# Patient Record
Sex: Male | Born: 1945 | Race: White | Hispanic: No | Marital: Married | Smoking: Former smoker
Health system: Southern US, Community
[De-identification: ages and names within clinical notes are randomized; demographics above are authoritative.]

## PROBLEM LIST (undated history)

## (undated) DIAGNOSIS — E119 Type 2 diabetes mellitus without complications: Secondary | ICD-10-CM

## (undated) DIAGNOSIS — N400 Enlarged prostate without lower urinary tract symptoms: Secondary | ICD-10-CM

## (undated) DIAGNOSIS — J449 Chronic obstructive pulmonary disease, unspecified: Secondary | ICD-10-CM

## (undated) DIAGNOSIS — G4733 Obstructive sleep apnea (adult) (pediatric): Secondary | ICD-10-CM

## (undated) DIAGNOSIS — I10 Essential (primary) hypertension: Secondary | ICD-10-CM

## (undated) HISTORY — DX: Essential (primary) hypertension: I10

## (undated) HISTORY — DX: Benign prostatic hyperplasia without lower urinary tract symptoms: N40.0

## (undated) HISTORY — PX: APPENDECTOMY: SHX54

## (undated) HISTORY — DX: Obstructive sleep apnea (adult) (pediatric): G47.33

## (undated) HISTORY — DX: Type 2 diabetes mellitus without complications: E11.9

## (undated) HISTORY — DX: Chronic obstructive pulmonary disease, unspecified: J44.9

## (undated) HISTORY — PX: ADENOIDECTOMY: SUR15

---

## 2013-07-20 ENCOUNTER — Encounter: Payer: Self-pay | Admitting: Internal Medicine

## 2013-07-20 ENCOUNTER — Ambulatory Visit (INDEPENDENT_AMBULATORY_CARE_PROVIDER_SITE_OTHER): Payer: Medicare HMO | Admitting: Internal Medicine

## 2013-07-20 ENCOUNTER — Encounter (INDEPENDENT_AMBULATORY_CARE_PROVIDER_SITE_OTHER): Payer: Self-pay

## 2013-07-20 VITALS — BP 154/90 | HR 90 | Temp 98.8°F | Ht 69.0 in | Wt 233.0 lb

## 2013-07-20 DIAGNOSIS — J438 Other emphysema: Secondary | ICD-10-CM

## 2013-07-20 DIAGNOSIS — N400 Enlarged prostate without lower urinary tract symptoms: Secondary | ICD-10-CM

## 2013-07-20 DIAGNOSIS — G4733 Obstructive sleep apnea (adult) (pediatric): Secondary | ICD-10-CM | POA: Insufficient documentation

## 2013-07-20 DIAGNOSIS — J449 Chronic obstructive pulmonary disease, unspecified: Secondary | ICD-10-CM | POA: Insufficient documentation

## 2013-07-20 DIAGNOSIS — I10 Essential (primary) hypertension: Secondary | ICD-10-CM | POA: Insufficient documentation

## 2013-07-20 DIAGNOSIS — E119 Type 2 diabetes mellitus without complications: Secondary | ICD-10-CM

## 2013-07-20 LAB — CBC WITH DIFFERENTIAL/PLATELET
BASOS ABS: 0 10*3/uL (ref 0.0–0.1)
BASOS PCT: 0 % (ref 0–1)
EOS ABS: 0.1 10*3/uL (ref 0.0–0.7)
Eosinophils Relative: 1 % (ref 0–5)
HCT: 44.1 % (ref 39.0–52.0)
Hemoglobin: 15.3 g/dL (ref 13.0–17.0)
Lymphocytes Relative: 21 % (ref 12–46)
Lymphs Abs: 1.5 10*3/uL (ref 0.7–4.0)
MCH: 32.3 pg (ref 26.0–34.0)
MCHC: 34.7 g/dL (ref 30.0–36.0)
MCV: 93 fL (ref 78.0–100.0)
Monocytes Absolute: 0.4 10*3/uL (ref 0.1–1.0)
Monocytes Relative: 6 % (ref 3–12)
Neutro Abs: 5.1 10*3/uL (ref 1.7–7.7)
Neutrophils Relative %: 72 % (ref 43–77)
PLATELETS: 138 10*3/uL — AB (ref 150–400)
RBC: 4.74 MIL/uL (ref 4.22–5.81)
RDW: 14.4 % (ref 11.5–15.5)
WBC: 7.1 10*3/uL (ref 4.0–10.5)

## 2013-07-20 MED ORDER — TRAZODONE HCL 50 MG PO TABS: 50.0000 mg | ORAL_TABLET | Freq: Every day | ORAL | Status: AC

## 2013-07-20 MED ORDER — METFORMIN HCL 500 MG PO TABS: 500.0000 mg | ORAL_TABLET | Freq: Two times a day (BID) | ORAL | Status: AC

## 2013-07-20 MED ORDER — LOSARTAN POTASSIUM 25 MG PO TABS: 25.0000 mg | ORAL_TABLET | Freq: Every day | ORAL | Status: AC

## 2013-07-20 NOTE — Assessment & Plan Note (Signed)
Sounds like he might have become impaired fasting glucose after improving diet Will check labs

## 2013-07-20 NOTE — Progress Notes (Signed)
rx called into pharmacy

## 2013-07-20 NOTE — Progress Notes (Signed)
Subjective:    Patient ID: Charles Murphy, male    DOB: 03/28/1945, 68 y.o.   MRN: [id number]  HPI Here to establish He is in town living with his father--who is home bound on hospice care [place] in [place]  Has diabetes-though he wonders about this. His sugars were high but improved just off [place] Diagnosed in ~2000 He does not monitor his sugars regularly Gets blood work every 6 months--- last A1c ~6%  Was put on losartan recently Only took 1--then dizzy the next morning Stopped it then  Cholesterol always fine Never been on machine  Obstructive sleep apnea Had septoplasty and adenoidectomy Better since then but still needs to use the CPAP nightly Does use the trazodone nightly  No current outpatient prescriptions on file prior to visit.   No current facility-administered medications on file prior to visit.    No Known Allergies  Past Medical History  Diagnosis Date  . Type II or unspecified type diabetes mellitus without mention of complication, not stated as uncontrolled   . Hypertension   . Obstructive sleep apnea   . BPH (benign prostatic hypertrophy)     Past Surgical History  Procedure Laterality Date  . Adenoidectomy      and septal repair  . Appendectomy      Family History  Problem Relation Age of Onset  . Cancer Mother     breast cancer  . Cancer Father     mesothelioma  . Cancer Brother     prostate cancer    History   Social History  . Marital Status: Married    Spouse Name: N/A    Number of Children: 2  . Years of Education: N/A   Occupational History  . 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 Smoking status: Former Games developermoker  . Smokeless tobacco: Never Used     Comment: rare cigar  . Alcohol Use: Yes  . Drug Use: No  . Sexual Activity: Not on file   Other Topics Concern  . Not on file   Social History Narrative    . No narrative on file   Review of Systems  Constitutional:       May have lost some weight recently (12#) Wears seat belt  HENT: Positive for hearing loss. Negative for tinnitus.        Has bilateral hearing aides Upcoming dental appt  Eyes: Negative for visual disturbance.       No retinopathy Regular with eye doctor  Respiratory: Positive for shortness of breath. Negative for cough and chest tightness.        Stable DOE  Cardiovascular: Negative for chest pain, palpitations and leg swelling.  Gastrointestinal: Negative for nausea, vomiting, abdominal pain, constipation and blood in stool.       No heartburn  Genitourinary: Negative for urgency and frequency.       Mild urinary symptoms  Musculoskeletal: Negative for arthralgias and back pain.  Neurological: Positive for dizziness. Negative for syncope, weakness and numbness.  Psychiatric/Behavioral: Positive for sleep disturbance. Negative for dysphoric mood. The patient is not nervous/anxious.        Objective:   Physical Exam  Constitutional: He appears well-developed and well-nourished. No distress.  HENT:  Mouth/Throat: Oropharynx is clear and moist. No oropharyngeal exudate.  Neck: Normal range of motion. Neck supple. No thyromegaly  present.  Cardiovascular: Normal rate, regular rhythm, normal heart sounds and intact distal pulses.  Exam reveals no gallop.   No murmur heard. Pulmonary/Chest: Effort normal and breath sounds normal. No respiratory distress. He has no wheezes. He has no rales.  Abdominal: Soft. There is no tenderness.  Musculoskeletal: He exhibits no edema and no tenderness.  Lymphadenopathy:    He has no cervical adenopathy.  Neurological:  Normal fine touch sensation in feet  Skin:  Brawny stasis changes on lower calves No foot ulcers  Psychiatric: He has a normal mood and affect. His behavior is normal.          Assessment & Plan:

## 2013-07-20 NOTE — Assessment & Plan Note (Signed)
BP Readings from Last 3 Encounters:  07/20/13 154/90   Urged him to restart the losartan

## 2013-07-20 NOTE — Assessment & Plan Note (Signed)
Mild symptoms Will hold off on tamsulosin to avoid possible dizziness with restarting losartan

## 2013-07-20 NOTE — Assessment & Plan Note (Signed)
Has improved some lately No meds

## 2013-07-21 LAB — COMPREHENSIVE METABOLIC PANEL
ALK PHOS: 83 U/L (ref 39–117)
ALT: 25 U/L (ref 0–53)
AST: 19 U/L (ref 0–37)
Albumin: 4.4 g/dL (ref 3.5–5.2)
BUN: 13 mg/dL (ref 6–23)
CO2: 25 meq/L (ref 19–32)
Calcium: 9.6 mg/dL (ref 8.4–10.5)
Chloride: 99 mEq/L (ref 96–112)
Creat: 0.93 mg/dL (ref 0.50–1.35)
Glucose, Bld: 382 mg/dL — ABNORMAL HIGH (ref 70–99)
Potassium: 4.4 mEq/L (ref 3.5–5.3)
Sodium: 135 mEq/L (ref 135–145)
TOTAL PROTEIN: 6.7 g/dL (ref 6.0–8.3)
Total Bilirubin: 1.3 mg/dL — ABNORMAL HIGH (ref 0.2–1.2)

## 2013-07-21 LAB — LIPID PANEL
Cholesterol: 106 mg/dL (ref 0–200)
HDL: 38 mg/dL — ABNORMAL LOW (ref >39)
LDL CALC: 26 mg/dL (ref 0–99)
Total CHOL/HDL Ratio: 2.8 Ratio
Triglycerides: 212 mg/dL — ABNORMAL HIGH (ref <150)
VLDL: 42 mg/dL — ABNORMAL HIGH (ref 0–40)

## 2013-07-21 LAB — HEMOGLOBIN A1C
HEMOGLOBIN A1C: 8.3 % — AB (ref <5.7)
Mean Plasma Glucose: 192 mg/dL — ABNORMAL HIGH (ref <117)

## 2013-07-21 LAB — T4, FREE: Free T4: 1.05 ng/dL (ref 0.80–1.80)

## 2013-07-23 ENCOUNTER — Encounter: Payer: Self-pay | Admitting: *Deleted

## 2013-07-23 ENCOUNTER — Telehealth: Payer: Self-pay | Admitting: Internal Medicine

## 2013-07-23 NOTE — Telephone Encounter (Signed)
Relevant patient education assigned to patient using Emmi. ° °

## 2014-07-05 ENCOUNTER — Telehealth: Payer: Self-pay

## 2014-07-05 NOTE — Telephone Encounter (Signed)
Diabetic Bundle. Left voicemail advising pt his A1C blood test is due. Pt advised to contact PCP's office to schedule.  

## 2016-05-19 ENCOUNTER — Emergency Department
Admission: EM | Admit: 2016-05-19 | Discharge: 2016-05-19 | Disposition: A | Discharge status: Home / Self Care | Payer: Medicare HMO | Attending: Emergency Medicine | Admitting: Emergency Medicine

## 2016-05-19 ENCOUNTER — Emergency Department: Payer: Medicare HMO

## 2016-05-19 DIAGNOSIS — J209 Acute bronchitis, unspecified: Secondary | ICD-10-CM | POA: Diagnosis not present

## 2016-05-19 DIAGNOSIS — Z87891 Personal history of nicotine dependence: Secondary | ICD-10-CM | POA: Insufficient documentation

## 2016-05-19 DIAGNOSIS — Z7982 Long term (current) use of aspirin: Secondary | ICD-10-CM | POA: Insufficient documentation

## 2016-05-19 DIAGNOSIS — J449 Chronic obstructive pulmonary disease, unspecified: Secondary | ICD-10-CM | POA: Insufficient documentation

## 2016-05-19 DIAGNOSIS — I10 Essential (primary) hypertension: Secondary | ICD-10-CM | POA: Diagnosis not present

## 2016-05-19 DIAGNOSIS — Z7984 Long term (current) use of oral hypoglycemic drugs: Secondary | ICD-10-CM | POA: Insufficient documentation

## 2016-05-19 DIAGNOSIS — R509 Fever, unspecified: Secondary | ICD-10-CM | POA: Diagnosis present

## 2016-05-19 DIAGNOSIS — E119 Type 2 diabetes mellitus without complications: Secondary | ICD-10-CM | POA: Diagnosis not present

## 2016-05-19 LAB — BASIC METABOLIC PANEL
Anion gap: 6 (ref 5–15)
BUN: 16 mg/dL (ref 6–20)
CALCIUM: 8.8 mg/dL — AB (ref 8.9–10.3)
CO2: 29 mmol/L (ref 22–32)
Chloride: 103 mmol/L (ref 101–111)
Creatinine, Ser: 1.11 mg/dL (ref 0.61–1.24)
GFR calc Af Amer: >60 mL/min (ref >60)
GLUCOSE: 149 mg/dL — AB (ref 65–99)
POTASSIUM: 3.9 mmol/L (ref 3.5–5.1)
Sodium: 138 mmol/L (ref 135–145)

## 2016-05-19 LAB — CBC
HEMATOCRIT: 40 % (ref 40.0–52.0)
Hemoglobin: 14.1 g/dL (ref 13.0–18.0)
MCH: 32.7 pg (ref 26.0–34.0)
MCHC: 35.1 g/dL (ref 32.0–36.0)
MCV: 93.1 fL (ref 80.0–100.0)
PLATELETS: 129 10*3/uL — AB (ref 150–440)
RBC: 4.3 MIL/uL — ABNORMAL LOW (ref 4.40–5.90)
RDW: 15.6 % — AB (ref 11.5–14.5)
WBC: 3.9 10*3/uL (ref 3.8–10.6)

## 2016-05-19 LAB — TROPONIN I: Troponin I: <0.03 ng/mL (ref <0.03)

## 2016-05-19 MED ORDER — ALBUTEROL SULFATE HFA 108 (90 BASE) MCG/ACT IN AERS: 2.0000 | INHALATION_SPRAY | Freq: Four times a day (QID) | RESPIRATORY_TRACT | 0 refills | Status: AC | PRN

## 2016-05-19 MED ORDER — AZITHROMYCIN 500 MG PO TABS
500.0000 mg | ORAL_TABLET | Freq: Once | ORAL | Status: AC
  Administered 2016-05-19: 500 mg via ORAL
  Filled 2016-05-19: qty 1

## 2016-05-19 MED ORDER — ALBUTEROL SULFATE (2.5 MG/3ML) 0.083% IN NEBU
5.0000 mg | INHALATION_SOLUTION | Freq: Once | RESPIRATORY_TRACT | Status: AC
  Administered 2016-05-19: 5 mg via RESPIRATORY_TRACT
  Filled 2016-05-19: qty 6

## 2016-05-19 MED ORDER — HYDROCOD POLST-CPM POLST ER 10-8 MG/5ML PO SUER
5.0000 mL | Freq: Once | ORAL | Status: AC
  Administered 2016-05-19: 5 mL via ORAL
  Filled 2016-05-19: qty 5

## 2016-05-19 MED ORDER — HYDROCOD POLST-CPM POLST ER 10-8 MG/5ML PO SUER: 5.0000 mL | Freq: Two times a day (BID) | ORAL | 0 refills | Status: AC | PRN

## 2016-05-19 MED ORDER — AZITHROMYCIN 250 MG PO TABS: ORAL_TABLET | ORAL | 0 refills | Status: AC

## 2016-05-19 NOTE — ED Notes (Signed)
Report given to Kuch, RN.  

## 2016-05-19 NOTE — ED Notes (Signed)
Patient transported to X-ray via wheelchair 

## 2016-05-19 NOTE — ED Triage Notes (Signed)
Pt presents to ED via POV with c/o fever and SHOB x2-3 days. Pt reports history of COPD. Pt reports right-sided rib pain d/t a fractured rib on May 4th. Pt is A&O, in NAD, with respirations even, regular, and unlabored. Skin is WPD, cap refill <3 secs.

## 2016-05-19 NOTE — ED Provider Notes (Signed)
[facility] Emergency Department Provider Note    First MD Initiated Contact with Patient 05/19/16 2321     (approximate)  I have reviewed the triage vital signs and the nursing notes.   HISTORY  Chief Complaint Shortness of Breath and Fever    HPI Charles Murphy is a 71 y.o. male with below list of chronic medical conditions including COPD and recent fall with resultant right rib fracture present to the emergency department with complaint of fever temperature today of 100.1 as well as progressive dyspnea and cough. Patient states that his current pain is 6 out of 10 located in the right chest wall described as sharp.   Past Medical History:  Diagnosis Date  . BPH (benign prostatic hypertrophy)   . COPD (chronic obstructive pulmonary disease) (HCC)   . Hypertension   . Obstructive sleep apnea   . Type II or unspecified type diabetes mellitus without mention of complication, not stated as uncontrolled     Patient Active Problem List   Diagnosis Date Noted  . Type II or unspecified type diabetes mellitus without mention of complication, not stated as uncontrolled   . Hypertension   . Obstructive sleep apnea   . BPH (benign prostatic hypertrophy)   . COPD (chronic obstructive pulmonary disease) (HCC)     Past Surgical History:  Procedure Laterality Date  . ADENOIDECTOMY     and septal repair  . APPENDECTOMY      Prior to Admission medications   Medication Sig Start Date End Date Taking? Authorizing Provider  aspirin EC 81 MG tablet Take 81 mg by mouth every other day.    [provider]  losartan (COZAAR) 25 MG tablet Take 1 tablet (25 mg total) by mouth daily. 07/20/13   Karie Schwalbe, MD  metFORMIN (GLUCOPHAGE) 500 MG tablet Take 1 tablet (500 mg total) by mouth 2 (two) times daily with a meal. 07/20/13   Karie Schwalbe, MD  psyllium (REGULOID) 0.52 G capsule 10 tablets daily    [provider]  traZODone (DESYREL) 50  MG tablet Take 1-2 tablets (50-100 mg total) by mouth at bedtime. 07/20/13   Karie Schwalbe, MD    Allergies Patient has no known allergies.  Family History  Problem Relation Age of Onset  . Cancer Mother     breast cancer  . Cancer Father     mesothelioma  . Cancer Brother     prostate cancer    Social History Social History  Substance Use Topics  . Smoking status: Former Games developer  . Smokeless tobacco: Never Used     Comment: rare cigar  . Alcohol use Yes    Review of Systems Constitutional: No fever/chills Eyes: No visual changes. ENT: No sore throat. Cardiovascular: Positive for right chest wall pain Respiratory: Positive for shortness of breath. Gastrointestinal: No abdominal pain.  No nausea, no vomiting.  No diarrhea.  No constipation. Genitourinary: Negative for dysuria. Musculoskeletal: Negative for back pain. Integumentary: Negative for rash. Neurological: Negative for headaches, focal weakness or numbness.   ____________________________________________   PHYSICAL EXAM:  VITAL SIGNS: ED Triage Vitals  Enc Vitals Group     BP 05/19/16 2043 (!) 162/88     Pulse Rate 05/19/16 2043 (!) 102     Resp 05/19/16 2043 18     Temp 05/19/16 2043 99.4 F (37.4 C)     Temp Source 05/19/16 2043 Oral     SpO2 05/19/16 2043 93 %  Weight 05/19/16 2044 265 lb (120.2 kg)     Height 05/19/16 2044 5\' 9"  (1.753 m)     Head Circumference --      Peak Flow --      Pain Score 05/19/16 2043 6     Pain Loc --      Pain Edu? --      Excl. in GC? --     Constitutional: Alert and oriented. Well appearing and in no acute distress. Eyes: Conjunctivae are normal. PERRL. EOMI. Head: Atraumatic. Mouth/Throat: Mucous membranes are moist.  Oropharynx non-erythematous. Neck: No stridor.   Cardiovascular: Normal rate, regular rhythm. Good peripheral circulation. Grossly normal heart sounds. Respiratory: Normal respiratory effort.  No retractions. Bibasilar  rhonchi Gastrointestinal: Soft and nontender. No distention.  Musculoskeletal: No lower extremity tenderness nor edema. No gross deformities of extremities. Neurologic:  Normal speech and language. No gross focal neurologic deficits are appreciated.  Skin:  Skin is warm, dry and intact. No rash noted. Psychiatric: Mood and affect are normal. Speech and behavior are normal.  ____________________________________________   LABS (all labs ordered are listed, but only abnormal results are displayed)  Labs Reviewed  CBC - Abnormal; Notable for the following:       Result Value   RBC 4.30 (*)    RDW 15.6 (*)    Platelets 129 (*)    All other components within normal limits  BASIC METABOLIC PANEL - Abnormal; Notable for the following:    Glucose, Bld 149 (*)    Calcium 8.8 (*)    All other components within normal limits  TROPONIN I   ____________________________________________  EKG  ED ECG REPORT I, Walden N Chyla Schlender, the attending physician, personally viewed and interpreted this ECG.   Date: 05/19/2016  EKG Time: 8:44 PM  Rate: 109  Rhythm: Normal sinus rhythm  Axis: Normal  Intervals: Normal  ST&T Change: None  ____________________________________________  RADIOLOGY I, Monon N Beva Remund, personally viewed and evaluated these images (plain radiographs) as part of my medical decision making, as well as reviewing the written report by the radiologist.  Dg Chest 2 View  Result Date: 05/19/2016 CLINICAL DATA:  Shortness of breath for 3 days.  COPD. EXAM: CHEST  2 VIEW COMPARISON:  None. FINDINGS: The heart size and mediastinal contours are within normal limits. Both lungs are clear. Mild pulmonary hyperinflation is seen, consistent with COPD. No evidence of pneumothorax or pleural effusion. The visualized skeletal structures are unremarkable. IMPRESSION: No active cardiopulmonary disease. Electronically Signed   By: Myles RosenthalJohn  Stahl M.D.   On: 05/19/2016 21:08      Procedures   ____________________________________________   INITIAL IMPRESSION / ASSESSMENT AND PLAN / ED COURSE  Pertinent labs & imaging results that were available during my care of the patient were reviewed by me and considered in my medical decision making (see chart for details).  71 year old male presenting with dyspnea and fever status post fall with rib injury on May 4. Concern for possible pneumonia chest x-ray did not reveal pneumonia however on auscultation patient had bibasilar rhonchi. As such patient will be treated with azithromycin Tussirex for cough albuterol and he'll be prescribed as well.      ____________________________________________  FINAL CLINICAL IMPRESSION(S) / ED DIAGNOSES  Final diagnoses:  Acute bronchitis, unspecified organism     MEDICATIONS GIVEN DURING THIS VISIT:  Medications  albuterol (PROVENTIL) (2.5 MG/3ML) 0.083% nebulizer solution 5 mg (5 mg Nebulization Given 05/19/16 2102)     NEW OUTPATIENT MEDICATIONS STARTED DURING  THIS VISIT:  New Prescriptions   No medications on file    Modified Medications   No medications on file    Discontinued Medications   No medications on file     Note:  This document was prepared using Dragon voice recognition software and may include unintentional dictation errors.    Darci Current, MD 05/19/16 [phone]

## 2016-05-20 NOTE — ED Notes (Signed)
Pt and family taken their belongings with them, deny missing upon leaving the ED

## 2018-05-13 IMAGING — CR DG CHEST 2V
1 series · 2 of 2 positions shown · non-contrast
Comparison: None.

CLINICAL DATA: Shortness of breath for 3 days.  COPD.

EXAM:
CHEST  2 VIEW

[Series 1: dg chest 2 view · 0.14mm/px · 2 of 2 slices shown]
[im 1/2]
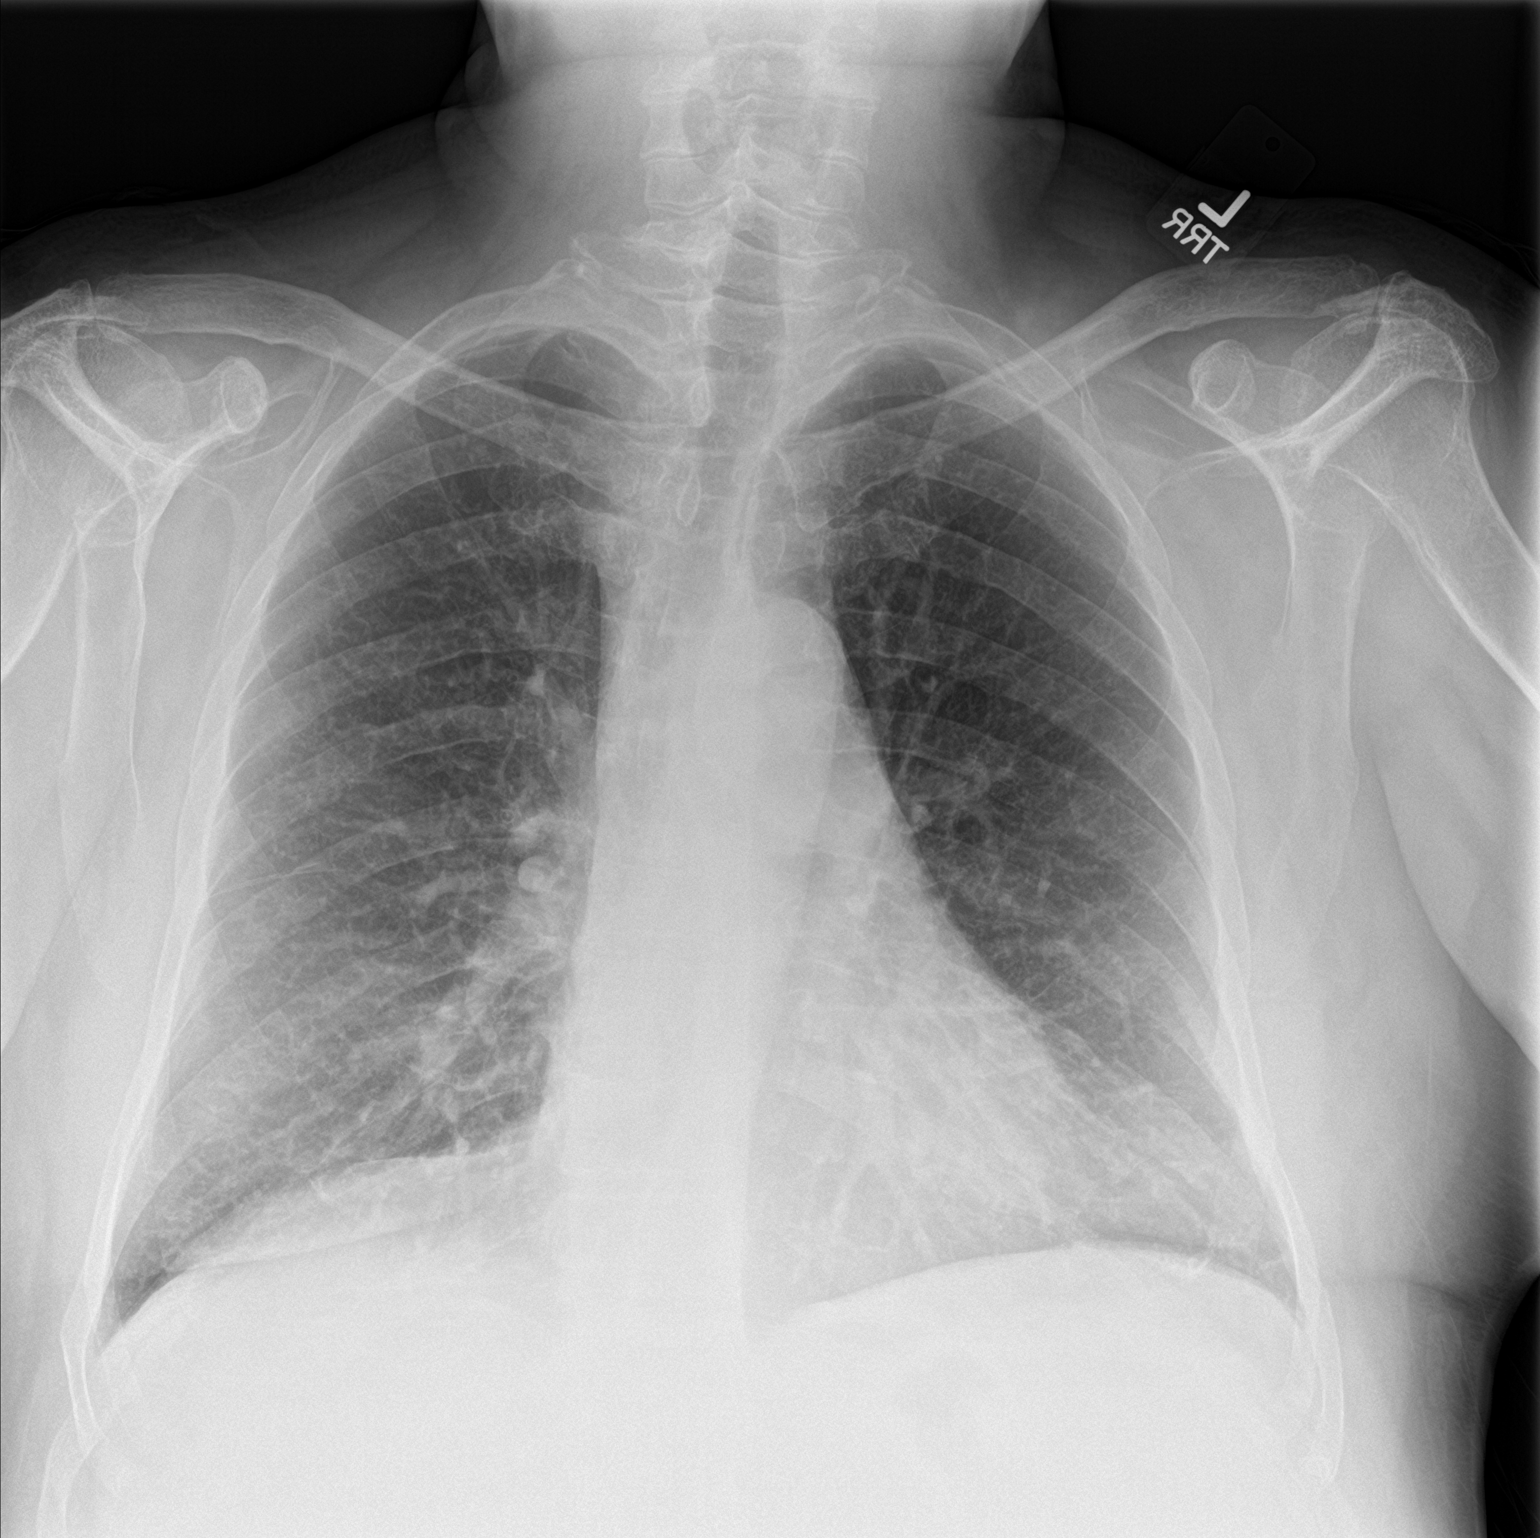
[im 2/2]
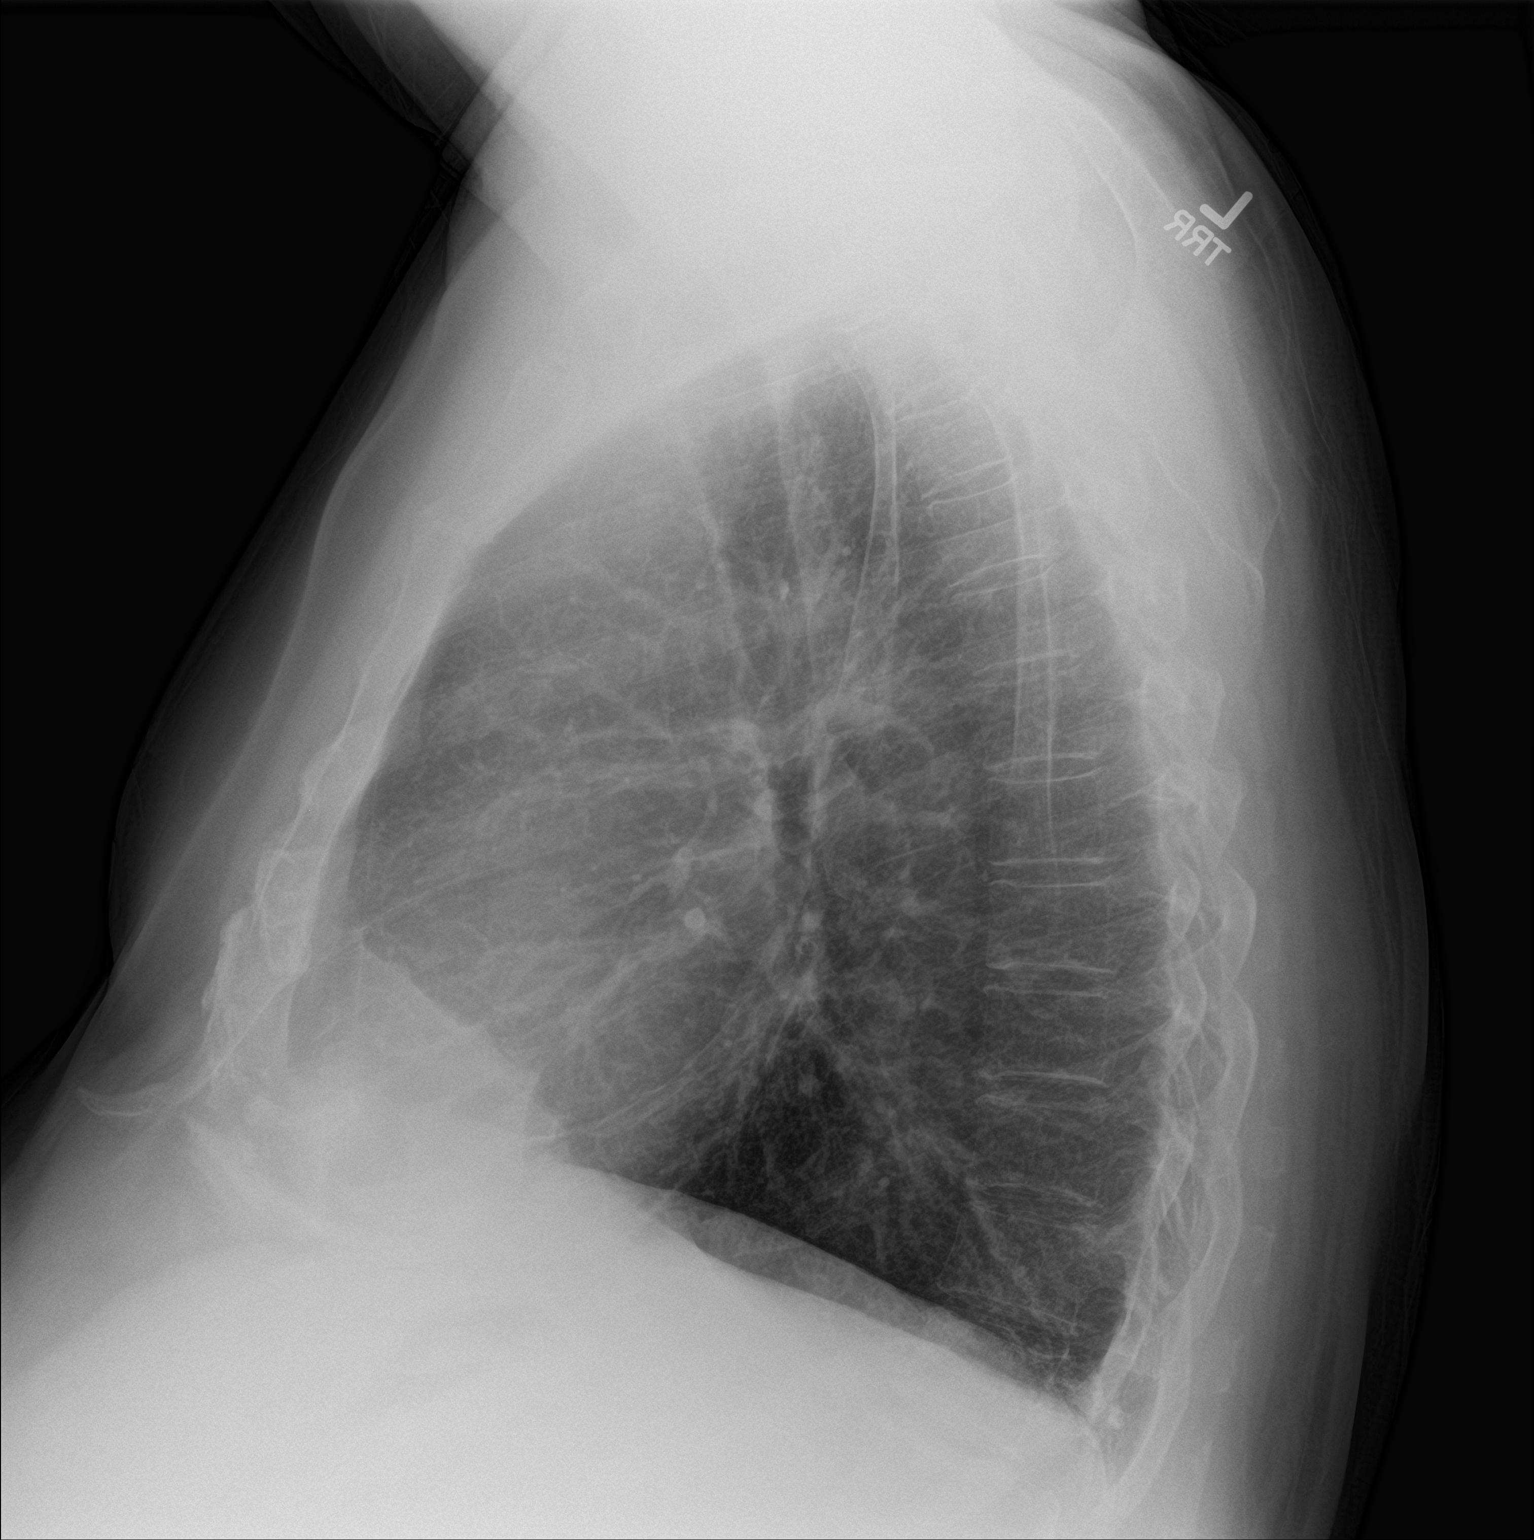

[2 of 2 positions shown; findings below may reference images not displayed]

FINDINGS: The heart size and mediastinal contours are within normal limits.
Both lungs are clear. Mild pulmonary hyperinflation is seen,
consistent with COPD. No evidence of pneumothorax or pleural
effusion. The visualized skeletal structures are unremarkable.
IMPRESSION: No active cardiopulmonary disease.
